# Patient Record
Sex: Female | Born: 1965 | Race: Asian | Hispanic: No | Marital: Married | Smoking: Never smoker
Health system: Southern US, Community
[De-identification: ages and names within clinical notes are randomized; demographics above are authoritative.]

## PROBLEM LIST (undated history)

## (undated) DIAGNOSIS — D259 Leiomyoma of uterus, unspecified: Secondary | ICD-10-CM

## (undated) DIAGNOSIS — R51 Headache: Secondary | ICD-10-CM

## (undated) HISTORY — PX: TUBAL LIGATION: SHX77

---

## 2002-02-03 HISTORY — PX: OTHER SURGICAL HISTORY: SHX169

## 2011-06-06 ENCOUNTER — Other Ambulatory Visit: Payer: Self-pay | Admitting: Family Medicine

## 2011-06-09 ENCOUNTER — Other Ambulatory Visit: Payer: Self-pay | Admitting: Family Medicine

## 2011-06-09 DIAGNOSIS — K115 Sialolithiasis: Secondary | ICD-10-CM

## 2011-06-11 ENCOUNTER — Ambulatory Visit
Admission: RE | Admit: 2011-06-11 | Discharge: 2011-06-11 | Disposition: A | Discharge status: Home / Self Care | Payer: Medicaid Other | Source: Ambulatory Visit | Attending: Family Medicine | Admitting: Family Medicine

## 2011-06-11 DIAGNOSIS — K115 Sialolithiasis: Secondary | ICD-10-CM

## 2011-06-12 ENCOUNTER — Other Ambulatory Visit: Payer: Self-pay

## 2011-06-12 ENCOUNTER — Other Ambulatory Visit: Payer: Self-pay | Admitting: Family Medicine

## 2011-06-12 DIAGNOSIS — K115 Sialolithiasis: Secondary | ICD-10-CM

## 2011-06-20 ENCOUNTER — Ambulatory Visit
Admission: RE | Admit: 2011-06-20 | Discharge: 2011-06-20 | Disposition: A | Discharge status: Home / Self Care | Payer: Medicaid Other | Source: Ambulatory Visit | Attending: Family Medicine | Admitting: Family Medicine

## 2011-06-20 DIAGNOSIS — K115 Sialolithiasis: Secondary | ICD-10-CM

## 2011-06-20 MED ORDER — IOHEXOL 300 MG/ML  SOLN
75.0000 mL | Freq: Once | INTRAMUSCULAR | Status: AC | PRN
  Administered 2011-06-20: 75 mL via INTRAVENOUS

## 2012-09-04 ENCOUNTER — Ambulatory Visit (INDEPENDENT_AMBULATORY_CARE_PROVIDER_SITE_OTHER): Payer: PRIVATE HEALTH INSURANCE | Admitting: Family Medicine

## 2012-09-04 VITALS — BP 110/78 | HR 71 | Temp 97.5°F | Resp 16 | Ht <= 58 in | Wt 127.0 lb

## 2012-09-04 DIAGNOSIS — R110 Nausea: Secondary | ICD-10-CM

## 2012-09-04 DIAGNOSIS — N939 Abnormal uterine and vaginal bleeding, unspecified: Secondary | ICD-10-CM

## 2012-09-04 DIAGNOSIS — N898 Other specified noninflammatory disorders of vagina: Secondary | ICD-10-CM

## 2012-09-04 DIAGNOSIS — R8290 Unspecified abnormal findings in urine: Secondary | ICD-10-CM

## 2012-09-04 DIAGNOSIS — R51 Headache: Secondary | ICD-10-CM

## 2012-09-04 LAB — POCT UA - MICROSCOPIC ONLY
Casts, Ur, LPF, POC: NEGATIVE
Crystals, Ur, HPF, POC: NEGATIVE
Mucus, UA: POSITIVE
Yeast, UA: NEGATIVE

## 2012-09-04 LAB — POCT URINALYSIS DIPSTICK
Bilirubin, UA: NEGATIVE
Glucose, UA: NEGATIVE
Ketones, UA: NEGATIVE
Nitrite, UA: NEGATIVE
Protein, UA: NEGATIVE
Spec Grav, UA: 1.02
Urobilinogen, UA: 0.2
pH, UA: 7

## 2012-09-04 LAB — POCT CBC
Granulocyte percent: 56.8 % (ref 37–80)
HCT, POC: 41.6 % (ref 37.7–47.9)
Hemoglobin: 13.4 g/dL (ref 12.2–16.2)
Lymph, poc: 2.6 (ref 0.6–3.4)
MCH, POC: 29.8 pg (ref 27–31.2)
MCHC: 32.2 g/dL (ref 31.8–35.4)
MCV: 92.4 fL (ref 80–97)
MID (cbc): 0.4 (ref 0–0.9)
MPV: 9.4 fL (ref 0–99.8)
POC Granulocyte: 4 (ref 2–6.9)
POC LYMPH PERCENT: 37.3 % (ref 10–50)
POC MID %: 5.9 % (ref 0–12)
Platelet Count, POC: 310 10*3/uL (ref 142–424)
RBC: 4.5 M/uL (ref 4.04–5.48)
RDW, POC: 13.7 %
WBC: 7.1 10*3/uL (ref 4.6–10.2)

## 2012-09-04 LAB — COMPREHENSIVE METABOLIC PANEL
Albumin: 4.2 g/dL (ref 3.5–5.2)
BUN: 10 mg/dL (ref 6–23)
CO2: 24 mEq/L (ref 19–32)
Calcium: 9.2 mg/dL (ref 8.4–10.5)
Chloride: 105 mEq/L (ref 96–112)
Creat: 0.52 mg/dL (ref 0.50–1.10)

## 2012-09-04 LAB — COMPREHENSIVE METABOLIC PANEL WITH GFR
ALT: 11 U/L (ref 0–35)
AST: 17 U/L (ref 0–37)
Alkaline Phosphatase: 49 U/L (ref 39–117)
Glucose, Bld: 86 mg/dL (ref 70–99)
Potassium: 4.3 meq/L (ref 3.5–5.3)
Sodium: 138 meq/L (ref 135–145)
Total Bilirubin: 0.3 mg/dL (ref 0.3–1.2)
Total Protein: 6.9 g/dL (ref 6.0–8.3)

## 2012-09-04 LAB — TSH: TSH: 3.774 u[IU]/mL (ref 0.350–4.500)

## 2012-09-04 NOTE — Progress Notes (Signed)
[facility]:  Office Visit  Chief Complaint:  Chief Complaint  Patient presents with  . establish care    HPI: Kimberly Schultz is a 47 y.o. female is  Here to establish care. She is a new patietn to our practice, she is originally from [place], has been here with her husband for the last 1.5 years.  Patient has been on her cycle for last 2 weeks, this is longer than normal for her, Gets cycle every month usually last 1 week.  Has not had any female exam/well woman check  When she came to the [place] her cycle changed, they became longer but this is the first one that has lasted 2 weeks First 4 days were heavy , after that she changes only 2 pads per day, more like spotting per pad She has  Never been pregnant, G0 She has been sexually active, no STDs No dizziness, no CP, + HA, + nausea only when she is in a moving vehicle. This has been a chronic problem since she came to the [place].  No pain with cycle I am unable to get information about ovarian, uterine or cervical cancer history or when her mother started having menopause There is a history of what sounds like ovarian cysts s/p single oophrectomy.   History reviewed. No pertinent past medical history. Past Surgical History  Procedure Laterality Date  . Oophrectomy  2004    in [place] for ovarian cysts/nodules   History   Social History  . Marital Status: Married    Spouse Name: N/A    Number of Children: N/A  . Years of Education: N/A   Social History Main Topics  . Smoking status: Never Smoker   . Smokeless tobacco: None  . Alcohol Use: No  . Drug Use: No  . Sexually Active: Yes   Other Topics Concern  . None   Social History Narrative  . None   History reviewed. No pertinent family history. Allergies  Allergen Reactions  . Fish Oil Swelling   Prior to Admission medications   Medication Sig Start Date End Date Taking? Authorizing Provider  Multiple Vitamin (MULTIVITAMIN) tablet Take 1 tablet by mouth  daily.   Yes Historical Provider, MD     ROS: The patient denies fevers, chills, night sweats, unintentional weight loss, chest pain, palpitations, wheezing, dyspnea on exertion,  vomiting, abdominal pain, dysuria, hematuria, melena, numbness, weakness, or tingling.   All other systems have been reviewed and were otherwise negative with the exception of those mentioned in the HPI and as above.    PHYSICAL EXAM: Filed Vitals:   09/04/12 1233  BP: 110/78  Pulse: 71  Temp: 97.5 F (36.4 C)  Resp: 16   Filed Vitals:   09/04/12 1233  Height: 4' 8.5" (1.435 m)  Weight: 127 lb (57.607 kg)   Body mass index is 27.98 kg/(m^2).  General: Alert, no acute distress HEENT:  Normocephalic, atraumatic, oropharynx patent. EOMI, PERRLA, fundoscopic exam is normal Cardiovascular:  Regular rate and rhythm, no rubs murmurs or gallops.  No Carotid bruits, radial pulse intact. No pedal edema.  Respiratory: Clear to auscultation bilaterally.  No wheezes, rales, or rhonchi.  No cyanosis, no use of accessory musculature GI: No organomegaly, abdomen is soft and non-tender, positive bowel sounds.  No masses. Skin: No rashes. Neurologic: Facial musculature symmetric. Psychiatric: Patient is appropriate throughout our interaction. Lymphatic: No cervical lymphadenopathy Musculoskeletal: Gait intact. Gu -looks normal, scant blood Adnexa is nontender. No rashes, no CMT,  no masses   LABS: Results for orders placed in visit on 09/04/12  POCT CBC      Result Value Range   WBC 7.1  4.6 - 10.2 K/uL   Lymph, poc 2.6  0.6 - 3.4   POC LYMPH PERCENT 37.3  10 - 50 %L   MID (cbc) 0.4  0 - 0.9   POC MID % 5.9  0 - 12 %M   POC Granulocyte 4.0  2 - 6.9   Granulocyte percent 56.8  37 - 80 %G   RBC 4.50  4.04 - 5.48 M/uL   Hemoglobin 13.4  12.2 - 16.2 g/dL   HCT, POC 16.1  09.6 - 47.9 %   MCV 92.4  80 - 97 fL   MCH, POC 29.8  27 - 31.2 pg   MCHC 32.2  31.8 - 35.4 g/dL   RDW, POC 04.5     Platelet Count, POC  310  142 - 424 K/uL   MPV 9.4  0 - 99.8 fL  POCT URINALYSIS DIPSTICK      Result Value Range   Color, UA yellow     Clarity, UA slightly cloudy     Glucose, UA neg     Bilirubin, UA neg     Ketones, UA neg     Spec Grav, UA 1.020     Blood, UA large     pH, UA 7.0     Protein, UA neg     Urobilinogen, UA 0.2     Nitrite, UA neg     Leukocytes, UA Trace    POCT UA - MICROSCOPIC ONLY      Result Value Range   WBC, Ur, HPF, POC 8-13     RBC, urine, microscopic 4-6     Bacteria, U Microscopic 2+     Mucus, UA pos     Epithelial cells, urine per micros 4-5     Crystals, Ur, HPF, POC neg     Casts, Ur, LPF, POC neg     Yeast, UA neg       EKG/XRAY:   Primary read interpreted by Dr. Conley Rolls at [facility].   ASSESSMENT/PLAN: Encounter Diagnoses  Name Primary?  . Abnormal vaginal bleeding Yes  . Headache(784.0)   . Nausea alone   . Abnormal urine finding    Language barrier, husbnad and friend are acting as translators Pleasant 38 y/o [place] woman who has been in the [place] for only 1.5 years,  who is here with her husband to establish care and also to get help for her 1x episode of mennorrhagia This has been onoing for 2 weeks On pelvic exam it looks like very scant bleeding, I will not do anything unless labs are abnormal.  Will not treat with abx until get  Urine cx results for possible contaminant in UA She gets nauseated from motion sickness of car, not used to it. Only happens in car. Advise to sit up front and focus on one object while car is moving.  Advise to return for annual exam with fasting labs and mammogram, cards for Drs Clelia Croft and Neva Seat given. If she continue to bleed consider pelvic US.  Will f/u with labs     Nashalie Sallis PHUONG, DO 09/04/2012 2:27 PM

## 2012-09-05 LAB — URINE CULTURE: Colony Count: 15000

## 2012-09-06 LAB — PAP IG, CT-NG, RFX HPV ASCU
Chlamydia Probe Amp: NEGATIVE
GC Probe Amp: NEGATIVE

## 2012-09-07 ENCOUNTER — Telehealth: Payer: Self-pay | Admitting: Family Medicine

## 2012-09-07 NOTE — Telephone Encounter (Signed)
LM that all labs are normal, Pap normal.

## 2012-09-11 ENCOUNTER — Ambulatory Visit (INDEPENDENT_AMBULATORY_CARE_PROVIDER_SITE_OTHER): Payer: PRIVATE HEALTH INSURANCE | Admitting: Family Medicine

## 2012-09-11 VITALS — BP 110/74 | HR 67 | Temp 97.9°F | Resp 12 | Ht <= 58 in | Wt 126.0 lb

## 2012-09-11 DIAGNOSIS — D259 Leiomyoma of uterus, unspecified: Secondary | ICD-10-CM

## 2012-09-11 DIAGNOSIS — Z0000 Encounter for general adult medical examination without abnormal findings: Secondary | ICD-10-CM

## 2012-09-11 LAB — POCT CBC
Granulocyte percent: 63.4 %G (ref 37–80)
HCT, POC: 44.2 % (ref 37.7–47.9)
Hemoglobin: 14.3 g/dL (ref 12.2–16.2)
Lymph, poc: 2.2 (ref 0.6–3.4)
MCH, POC: 30.1 pg (ref 27–31.2)
MCHC: 32.4 g/dL (ref 31.8–35.4)
MCV: 93.1 fL (ref 80–97)
MID (cbc): 0.5 (ref 0–0.9)
MPV: 10 fL (ref 0–99.8)
POC Granulocyte: 4.8 (ref 2–6.9)
POC LYMPH PERCENT: 29.9 %L (ref 10–50)
POC MID %: 6.7 %M (ref 0–12)
Platelet Count, POC: 315 10*3/uL (ref 142–424)
RBC: 4.75 M/uL (ref 4.04–5.48)
RDW, POC: 13.6 %
WBC: 7.5 10*3/uL (ref 4.6–10.2)

## 2012-09-11 LAB — POCT URINALYSIS DIPSTICK
Bilirubin, UA: NEGATIVE
Blood, UA: NEGATIVE
Glucose, UA: NEGATIVE
Ketones, UA: NEGATIVE
Leukocytes, UA: NEGATIVE
Nitrite, UA: NEGATIVE
Protein, UA: NEGATIVE
Spec Grav, UA: 1.025
Urobilinogen, UA: 0.2
pH, UA: 6

## 2012-09-11 LAB — LIPID PANEL
Cholesterol: 250 mg/dL — ABNORMAL HIGH (ref 0–200)
HDL: 51 mg/dL (ref >39)
LDL Cholesterol: 163 mg/dL — ABNORMAL HIGH (ref 0–99)
Total CHOL/HDL Ratio: 4.9 Ratio
Triglycerides: 178 mg/dL — ABNORMAL HIGH (ref <150)
VLDL: 36 mg/dL (ref 0–40)

## 2012-09-11 LAB — COMPREHENSIVE METABOLIC PANEL
ALT: 13 U/L (ref 0–35)
AST: 18 U/L (ref 0–37)
Albumin: 4.3 g/dL (ref 3.5–5.2)
Alkaline Phosphatase: 55 U/L (ref 39–117)
BUN: 7 mg/dL (ref 6–23)
CO2: 28 mEq/L (ref 19–32)
Calcium: 9.6 mg/dL (ref 8.4–10.5)
Chloride: 104 mEq/L (ref 96–112)
Creat: 0.59 mg/dL (ref 0.50–1.10)
Glucose, Bld: 86 mg/dL (ref 70–99)
Potassium: 4.7 mEq/L (ref 3.5–5.3)
Sodium: 141 mEq/L (ref 135–145)
Total Bilirubin: 0.6 mg/dL (ref 0.3–1.2)
Total Protein: 7.4 g/dL (ref 6.0–8.3)

## 2012-09-11 LAB — POCT URINE PREGNANCY: Preg Test, Ur: NEGATIVE

## 2012-09-11 LAB — TSH: TSH: 3.172 u[IU]/mL (ref 0.350–4.500)

## 2012-09-11 NOTE — Patient Instructions (Signed)
Your uterus is enlarged indicating a fibroid.  This needs further evaluation by a gynecologist.

## 2012-09-11 NOTE — Progress Notes (Signed)
Patient ID: Kimberly Schultz MRN: [id number], DOB: 26-Jan-1966, 47 y.o. Date of Encounter: 09/11/2012, 10:40 AM  Primary Physician: No PCP Per Patient  Chief Complaint: Physical (CPE)  HPI: 47 y.o. y/o female with history of noted below here for CPE.  Doing well. No issues/complaints.  LMP: last week Pap: Review of Systems: Consitutional: No fever, chills, fatigue, night sweats, lymphadenopathy, or weight changes. Eyes: No visual changes, eye redness, or discharge. ENT/Mouth: Ears: No otalgia, tinnitus, hearing loss, discharge. Nose: No congestion, rhinorrhea, sinus pain, or epistaxis. Throat: No sore throat, post nasal drip, or teeth pain. Cardiovascular: No CP, palpitations, diaphoresis, DOE, edema, orthopnea, PND. Respiratory: No cough, hemoptysis, SOB, or wheezing. Gastrointestinal: No anorexia, dysphagia, reflux, pain, nausea, vomiting, hematemesis, diarrhea, constipation, BRBPR, or melena. Breast: No discharge, pain, swelling, or mass. Genitourinary: No dysuria, frequency, urgency, hematuria, incontinence, nocturia, amenorrhea, vaginal discharge, pruritis, burning, abnormal bleeding, or pain. Musculoskeletal: No decreased ROM, myalgias, stiffness, joint swelling, or weakness. Skin: No rash, erythema, lesion changes, pain, warmth, jaundice, or pruritis. Neurological: No headache, dizziness, syncope, seizures, tremors, memory loss, coordination problems, or paresthesias. Psychological: No anxiety, depression, hallucinations, SI/HI. Endocrine: No fatigue, polydipsia, polyphagia, polyuria, or known diabetes. All other systems were reviewed and are otherwise negative.  No past medical history on file.   Past Surgical History  Procedure Laterality Date  . Oophrectomy  2004    in [place] for ovarian cysts/nodules  . Tubal ligation      Home Meds:  Prior to Admission medications   Medication Sig Start Date End Date Taking? Authorizing Provider  Multiple Vitamin (MULTIVITAMIN) tablet  Take 1 tablet by mouth daily.   Yes Historical Provider, MD    Allergies:  Allergies  Allergen Reactions  . Fish Oil Swelling    History   Social History  . Marital Status: Married    Spouse Name: N/A    Number of Children: N/A  . Years of Education: N/A   Occupational History  . Not on file.   Social History Main Topics  . Smoking status: Never Smoker   . Smokeless tobacco: Never Used  . Alcohol Use: No  . Drug Use: No  . Sexually Active: Yes   Other Topics Concern  . Not on file   Social History Narrative  . No narrative on file    No family history on file.  Physical Exam: Blood pressure 110/74, pulse 67, temperature 97.9 F (36.6 C), temperature source Oral, resp. rate 12, height 4' 8.5" (1.435 m), weight 126 lb (57.153 kg), last menstrual period 08/21/2012, SpO2 98.00%., Body mass index is 27.75 kg/(m^2). General: Well developed, well nourished, in no acute distress. HEENT: Normocephalic, atraumatic. Conjunctiva pink, sclera non-icteric. Pupils 2 mm constricting to 1 mm, round, regular, and equally reactive to light and accomodation. EOMI. Internal auditory canal clear. TMs with good cone of light and without pathology. Nasal mucosa pink. Nares are without discharge. No sinus tenderness. Oral mucosa pink. Dentition dental caries. Pharynx without exudate.   Neck: Supple. Trachea midline. No thyromegaly. Full ROM. No lymphadenopathy. Lungs: Clear to auscultation bilaterally without wheezes, rales, or rhonchi. Breathing is of normal effort and unlabored. Cardiovascular: RRR with S1 S2. No murmurs, rubs, or gallops appreciated. Distal pulses 2+ symmetrically. No carotid or abdominal bruits. Breast: Symmetrical. No masses. Nipples without discharge. Abdomen: Soft, non-tender, non-distended with normoactive bowel sounds. No hepatosplenomegaly or masses. No rebound/guarding. No CVA tenderness. Without hernias.  Genitourinary:  External genitalia without lesions. Vaginal  mucosa pink. Cervix pink and  without discharge. No cervical or adnexal tenderness. Uterus is at least 6 weeks size. Musculoskeletal: Full range of motion and 5/5 strength throughout. Without swelling, atrophy, tenderness, crepitus, or warmth. Extremities without clubbing, cyanosis, or edema. Calves supple. Skin: Warm and moist without erythema, ecchymosis, wounds, or rash. Neuro: A+Ox3. CN II-XII grossly intact. Moves all extremities spontaneously. Full sensation throughout. Normal gait. DTR 2+ throughout upper and lower extremities. Finger to nose intact. Psych:  Responds to questions appropriately with a normal affect.   Studies: CBC, CMET, Lipid, TSH all pending.   Assessment/Plan:  47 y.o. y/o female here for CPE.  Fibroid uterus with recent menorrhagia.  Will need gyn eval. - Results for orders placed in visit on 09/11/12  POCT URINE PREGNANCY      Result Value Range   Preg Test, Ur Negative    POCT URINALYSIS DIPSTICK      Result Value Range   Color, UA yellow\     Clarity, UA cloudy     Glucose, UA neg     Bilirubin, UA neg     Ketones, UA neg     Spec Grav, UA 1.025     Blood, UA neg     pH, UA 6.0     Protein, UA neg     Urobilinogen, UA 0.2     Nitrite, UA neg     Leukocytes, UA Negative    POCT CBC      Result Value Range   WBC 7.5  4.6 - 10.2 K/uL   Lymph, poc 2.2  0.6 - 3.4   POC LYMPH PERCENT 29.9  10 - 50 %L   MID (cbc) 0.5  0 - 0.9   POC MID % 6.7  0 - 12 %M   POC Granulocyte 4.8  2 - 6.9   Granulocyte percent 63.4  37 - 80 %G   RBC 4.75  4.04 - 5.48 M/uL   Hemoglobin 14.3  12.2 - 16.2 g/dL   HCT, POC 16.1  09.6 - 47.9 %   MCV 93.1  80 - 97 fL   MCH, POC 30.1  27 - 31.2 pg   MCHC 32.4  31.8 - 35.4 g/dL   RDW, POC 04.5     Platelet Count, POC 315  142 - 424 K/uL   MPV 10.0  0 - 99.8 fL   Annual physical exam - Plan: POCT urine pregnancy, POCT urinalysis dipstick, POCT CBC, Lipid panel, Comprehensive metabolic panel, TSH, Ambulatory referral to  Gynecology  Uterine fibroid - Plan: Ambulatory referral to Gynecology    Signed, Elvina Sidle, MD 09/11/2012 10:40 AM

## 2012-10-11 ENCOUNTER — Other Ambulatory Visit (HOSPITAL_COMMUNITY): Payer: Self-pay | Admitting: Obstetrics & Gynecology

## 2012-10-11 DIAGNOSIS — IMO0002 Reserved for concepts with insufficient information to code with codable children: Secondary | ICD-10-CM

## 2012-10-18 ENCOUNTER — Ambulatory Visit (HOSPITAL_COMMUNITY)
Admission: RE | Admit: 2012-10-18 | Discharge: 2012-10-18 | Disposition: A | Discharge status: Home / Self Care | Payer: 59 | Source: Ambulatory Visit | Attending: Obstetrics & Gynecology | Admitting: Obstetrics & Gynecology

## 2012-10-18 DIAGNOSIS — N949 Unspecified condition associated with female genital organs and menstrual cycle: Secondary | ICD-10-CM | POA: Insufficient documentation

## 2012-10-18 DIAGNOSIS — N7013 Chronic salpingitis and oophoritis: Secondary | ICD-10-CM | POA: Insufficient documentation

## 2012-10-18 DIAGNOSIS — N971 Female infertility of tubal origin: Secondary | ICD-10-CM | POA: Insufficient documentation

## 2012-10-18 DIAGNOSIS — IMO0002 Reserved for concepts with insufficient information to code with codable children: Secondary | ICD-10-CM

## 2012-10-18 MED ORDER — IOHEXOL 300 MG/ML  SOLN
20.0000 mL | Freq: Once | INTRAMUSCULAR | Status: AC | PRN
  Administered 2012-10-18: 20 mL via INTRAVENOUS

## 2012-12-13 ENCOUNTER — Other Ambulatory Visit: Payer: Self-pay | Admitting: Obstetrics & Gynecology

## 2012-12-21 ENCOUNTER — Other Ambulatory Visit: Payer: Self-pay | Admitting: Obstetrics & Gynecology

## 2012-12-21 NOTE — Patient Instructions (Addendum)
   Your procedure is scheduled on: Friday, Nov 21  Enter through the [employer] [facility] at: 945 AM Pick up the phone at the desk and dial [phone] and inform us of your arrival.  Please call this number if you have any problems the morning of surgery: [phone]  Remember: Do not eat or drink after midnight: Thursday Take these medicines the morning of surgery with a SIP OF WATER:  NONE  Do not wear jewelry, make-up, or FINGER nail polish No metal in your hair or on your body. Do not wear lotions, powders, perfumes. You may wear deodorant.  Please use your CHG wash as directed prior to surgery.  Do not shave anywhere for at least 12 hours prior to first CHG shower.  Do not bring valuables to the hospital. Contacts, dentures or bridgework may not be worn into surgery.  Leave suitcase in the car. After Surgery it may be brought to your room. For patients being admitted to the hospital, checkout time is 11:00am the day of discharge.  Patients discharged on the day of surgery will not be allowed to drive home.  HOME WITH HUSBAND PIANG  LIANSUM

## 2012-12-22 ENCOUNTER — Encounter (HOSPITAL_COMMUNITY)
Admission: RE | Admit: 2012-12-22 | Discharge: 2012-12-22 | Disposition: A | Discharge status: Home / Self Care | Payer: 59 | Source: Ambulatory Visit | Attending: Obstetrics & Gynecology | Admitting: Obstetrics & Gynecology

## 2012-12-22 ENCOUNTER — Encounter (HOSPITAL_COMMUNITY): Payer: Self-pay

## 2012-12-22 HISTORY — DX: Headache: R51

## 2012-12-22 LAB — CBC
HCT: 38.3 % (ref 36.0–46.0)
Hemoglobin: 13.1 g/dL (ref 12.0–15.0)
MCH: 29.5 pg (ref 26.0–34.0)
RBC: 4.44 MIL/uL (ref 3.87–5.11)
RDW: 12.8 % (ref 11.5–15.5)
WBC: 8 10*3/uL (ref 4.0–10.5)

## 2012-12-22 LAB — BASIC METABOLIC PANEL
Chloride: 102 mEq/L (ref 96–112)
Creatinine, Ser: 0.62 mg/dL (ref 0.50–1.10)
GFR calc Af Amer: >90 mL/min (ref >90)
Potassium: 4 mEq/L (ref 3.5–5.1)
Sodium: 136 mEq/L (ref 135–145)

## 2012-12-22 NOTE — Pre-Procedure Instructions (Signed)
Burmese Nurse, learning disability did not show up for PAT appt. today.  Husband Piang Liansum was with patient (he spoke/read [place]) at PAT appt. and translated for [place].  [place]ese interpreter was requested with [place].

## 2012-12-23 NOTE — H&P (Signed)
Kimberly Schultz is an 47 y.o. female  G0 with fibroid uterus and menorrhagia. She has several fibroids including a submucosal myoma, declined hysterectomy and desired limited surgery. Hence we are proceeding with hysteroscopic myomectomy. Patient has past h/o endometriosis, laparotomy and resection of endometriosis, noted to have left pelvic adhesions with tube, colon and ovary. She has longstanding infertility and understands IVF with donor eggs as option and has declined.  Nl recent Pap, but no prior records since she recently arrived from [place].   No LMP recorded.    Past Medical History  Diagnosis Date  . Headache(784.0)     otc med prn   Past Surgical History  Procedure Laterality Date  . Oophrectomy  2004    in [place] for ovarian cysts/nodules  . Tubal ligation     No family history on file.  Social History:  reports that she has never smoked. She has never used smokeless tobacco. She reports that she does not drink alcohol or use illicit drugs.  Allergies:  Allergies  Allergen Reactions  . Fish Oil Swelling    No prescriptions prior to admission   Review of Systems  Constitutional: Negative for fever.  Eyes: Negative for blurred vision.  Respiratory: Negative for cough and shortness of breath.   Neurological: Negative for dizziness and headaches.    There were no vitals taken for this visit. Physical Exam A&O x 3, no acute distress. Pleasant HEENT neg, no thyromegaly Lungs CTA bilat CV RRR, S1S2 normal Abdo soft, non tender, non acute Extr no edema/ tenderness Pelvic enlarged uterus with nodularity.    Assessment/Plan: 47 yo female with fibroid uterus including a submucosal myoma and menorrhagia. Patient has h/o endometriosis and infertility but deferring laparoscopy due to lack of pain or dyspareunia and considering she is 47, does not want to pursue infertility surgery due to cost. She is here for hysteroscopy myomectomy with resectoscope. Risks/complications of  surgery reviewed, esp. Incomplete resection if too large or bloody and risk of perforation reviewed.  Risks/complications of surgery reviewed incl infection, bleeding, damage to internal organs including bladder, bowels, ureters, blood vessels, other risks from anesthesia, VTE and delayed complications of any surgery, complications in future surgery reviewed.   Cory Kitt R 12/23/2012, 10:35 PM

## 2012-12-24 ENCOUNTER — Encounter (HOSPITAL_COMMUNITY): Payer: Self-pay | Admitting: Obstetrics & Gynecology

## 2012-12-24 ENCOUNTER — Encounter (HOSPITAL_COMMUNITY)
Admission: RE | Disposition: A | Discharge status: Home / Self Care | Payer: Self-pay | Source: Ambulatory Visit | Attending: Obstetrics & Gynecology

## 2012-12-24 ENCOUNTER — Ambulatory Visit (HOSPITAL_COMMUNITY)
Admission: RE | Admit: 2012-12-24 | Discharge: 2012-12-24 | Disposition: A | Discharge status: Home / Self Care | Payer: 59 | Source: Ambulatory Visit | Attending: Obstetrics & Gynecology | Admitting: Obstetrics & Gynecology

## 2012-12-24 ENCOUNTER — Encounter (HOSPITAL_COMMUNITY): Payer: Self-pay | Admitting: Pharmacy Technician

## 2012-12-24 ENCOUNTER — Encounter (HOSPITAL_COMMUNITY): Payer: 59 | Admitting: Anesthesiology

## 2012-12-24 ENCOUNTER — Ambulatory Visit (HOSPITAL_COMMUNITY): Payer: 59 | Admitting: Anesthesiology

## 2012-12-24 DIAGNOSIS — D259 Leiomyoma of uterus, unspecified: Secondary | ICD-10-CM

## 2012-12-24 DIAGNOSIS — N920 Excessive and frequent menstruation with regular cycle: Secondary | ICD-10-CM | POA: Insufficient documentation

## 2012-12-24 DIAGNOSIS — N979 Female infertility, unspecified: Secondary | ICD-10-CM | POA: Insufficient documentation

## 2012-12-24 DIAGNOSIS — N840 Polyp of corpus uteri: Secondary | ICD-10-CM | POA: Insufficient documentation

## 2012-12-24 DIAGNOSIS — D250 Submucous leiomyoma of uterus: Secondary | ICD-10-CM | POA: Insufficient documentation

## 2012-12-24 HISTORY — DX: Leiomyoma of uterus, unspecified: D25.9

## 2012-12-24 HISTORY — PX: DILITATION & CURRETTAGE/HYSTROSCOPY WITH VERSAPOINT RESECTION: SHX5571

## 2012-12-24 SURGERY — DILATATION & CURETTAGE/HYSTEROSCOPY WITH VERSAPOINT RESECTION
Anesthesia: General | Site: Cervix | Wound class: Clean Contaminated

## 2012-12-24 MED ORDER — LIDOCAINE-EPINEPHRINE 1 %-1:100000 IJ SOLN
INTRAMUSCULAR | Status: AC
  Filled 2012-12-24: qty 1

## 2012-12-24 MED ORDER — PROPOFOL 10 MG/ML IV BOLUS
INTRAVENOUS | Status: DC | PRN
  Administered 2012-12-24: 150 mg via INTRAVENOUS

## 2012-12-24 MED ORDER — LIDOCAINE HCL (CARDIAC) 20 MG/ML IV SOLN
INTRAVENOUS | Status: DC | PRN
  Administered 2012-12-24: 40 mg via INTRAVENOUS
  Administered 2012-12-24: 20 mg via INTRAVENOUS

## 2012-12-24 MED ORDER — KETOROLAC TROMETHAMINE 30 MG/ML IJ SOLN
INTRAMUSCULAR | Status: AC
  Filled 2012-12-24: qty 1

## 2012-12-24 MED ORDER — LIDOCAINE HCL 1 % IJ SOLN
INTRAMUSCULAR | Status: AC
  Filled 2012-12-24: qty 20

## 2012-12-24 MED ORDER — LIDOCAINE HCL (CARDIAC) 20 MG/ML IV SOLN
INTRAVENOUS | Status: AC
  Filled 2012-12-24: qty 5

## 2012-12-24 MED ORDER — ONDANSETRON HCL 4 MG/2ML IJ SOLN
INTRAMUSCULAR | Status: DC | PRN
  Administered 2012-12-24: 4 mg via INTRAVENOUS

## 2012-12-24 MED ORDER — FENTANYL CITRATE 0.05 MG/ML IJ SOLN
INTRAMUSCULAR | Status: DC | PRN
  Administered 2012-12-24 (×3): 50 ug via INTRAVENOUS

## 2012-12-24 MED ORDER — MIDAZOLAM HCL 2 MG/2ML IJ SOLN
INTRAMUSCULAR | Status: DC | PRN
  Administered 2012-12-24: 1 mg via INTRAVENOUS

## 2012-12-24 MED ORDER — MEPERIDINE HCL 25 MG/ML IJ SOLN: 6.2500 mg | INTRAMUSCULAR | Status: DC | PRN

## 2012-12-24 MED ORDER — MIDAZOLAM HCL 2 MG/2ML IJ SOLN
INTRAMUSCULAR | Status: AC
  Filled 2012-12-24: qty 2

## 2012-12-24 MED ORDER — CEFAZOLIN SODIUM-DEXTROSE 2-3 GM-% IV SOLR
INTRAVENOUS | Status: AC
  Filled 2012-12-24: qty 50

## 2012-12-24 MED ORDER — KETOROLAC TROMETHAMINE 30 MG/ML IJ SOLN
INTRAMUSCULAR | Status: DC | PRN
  Administered 2012-12-24: 30 mg via INTRAVENOUS

## 2012-12-24 MED ORDER — FENTANYL CITRATE 0.05 MG/ML IJ SOLN
INTRAMUSCULAR | Status: AC
  Filled 2012-12-24: qty 2

## 2012-12-24 MED ORDER — IBUPROFEN 200 MG PO TABS: 400.0000 mg | ORAL_TABLET | Freq: Four times a day (QID) | ORAL | Status: AC | PRN

## 2012-12-24 MED ORDER — KETOROLAC TROMETHAMINE 30 MG/ML IJ SOLN: 15.0000 mg | Freq: Once | INTRAMUSCULAR | Status: DC | PRN

## 2012-12-24 MED ORDER — PROPOFOL 10 MG/ML IV EMUL
INTRAVENOUS | Status: AC
  Filled 2012-12-24: qty 20

## 2012-12-24 MED ORDER — CEFAZOLIN SODIUM-DEXTROSE 2-3 GM-% IV SOLR
2.0000 g | INTRAVENOUS | Status: AC
  Administered 2012-12-24: 2 g via INTRAVENOUS

## 2012-12-24 MED ORDER — LIDOCAINE-EPINEPHRINE 1 %-1:100000 IJ SOLN
INTRAMUSCULAR | Status: DC | PRN
  Administered 2012-12-24: 10 mL

## 2012-12-24 MED ORDER — LACTATED RINGERS IV SOLN
INTRAVENOUS | Status: DC
  Administered 2012-12-24 (×2): via INTRAVENOUS

## 2012-12-24 MED ORDER — FENTANYL CITRATE 0.05 MG/ML IJ SOLN: 25.0000 ug | INTRAMUSCULAR | Status: DC | PRN

## 2012-12-24 MED ORDER — ONDANSETRON HCL 4 MG/2ML IJ SOLN
INTRAMUSCULAR | Status: AC
  Filled 2012-12-24: qty 2

## 2012-12-24 MED ORDER — METOCLOPRAMIDE HCL 5 MG/ML IJ SOLN: 10.0000 mg | Freq: Once | INTRAMUSCULAR | Status: DC | PRN

## 2012-12-24 SURGICAL SUPPLY — 19 items
CANISTER SUCT 3000ML (MISCELLANEOUS) ×2 IMPLANT
CATH ROBINSON RED A/P 16FR (CATHETERS) ×2 IMPLANT
CLOTH BEACON ORANGE TIMEOUT ST (SAFETY) ×2 IMPLANT
CONTAINER PREFILL 10% NBF 60ML (FORM) ×4 IMPLANT
DRESSING TELFA 8X3 (GAUZE/BANDAGES/DRESSINGS) ×2 IMPLANT
ELECT REM PT RETURN 9FT ADLT (ELECTROSURGICAL) ×2
ELECTRODE REM PT RTRN 9FT ADLT (ELECTROSURGICAL) ×1 IMPLANT
ELECTRODE ROLLER VERSAPOINT (ELECTRODE) ×2 IMPLANT
ELECTRODE RT ANGLE VERSAPOINT (CUTTING LOOP) IMPLANT
GLOVE BIO SURGEON STRL SZ7 (GLOVE) ×2 IMPLANT
GLOVE BIOGEL PI IND STRL 7.0 (GLOVE) ×1 IMPLANT
GLOVE BIOGEL PI INDICATOR 7.0 (GLOVE) ×1
GLOVE SURG SS PI 7.0 STRL IVOR (GLOVE) ×4 IMPLANT
GOWN STRL REIN XL XLG (GOWN DISPOSABLE) ×6 IMPLANT
LOOP ANGLED CUTTING 22FR (CUTTING LOOP) IMPLANT
PACK HYSTEROSCOPY LF (CUSTOM PROCEDURE TRAY) ×2 IMPLANT
PAD OB MATERNITY 4.3X12.25 (PERSONAL CARE ITEMS) ×2 IMPLANT
TOWEL OR 17X24 6PK STRL BLUE (TOWEL DISPOSABLE) ×4 IMPLANT
WATER STERILE IRR 1000ML POUR (IV SOLUTION) ×2 IMPLANT

## 2012-12-24 NOTE — Anesthesia Preprocedure Evaluation (Addendum)
Anesthesia Evaluation  Patient identified by MRN, date of birth, ID band Patient awake    Reviewed: Allergy & Precautions, H&P , NPO status , Patient's Chart, lab work & pertinent test results, reviewed documented beta blocker date and time   History of Anesthesia Complications Negative for: history of anesthetic complications  Airway Mallampati: III TM Distance: >3 FB Neck ROM: full    Dental  (+) Teeth Intact   Pulmonary neg pulmonary ROS,  breath sounds clear to auscultation  Pulmonary exam normal       Cardiovascular Exercise Tolerance: Good negative cardio ROS  Rhythm:regular Rate:Normal     Neuro/Psych negative neurological ROS  negative psych ROS   GI/Hepatic negative GI ROS, Neg liver ROS,   Endo/Other  negative endocrine ROS  Renal/GU negative Renal ROS  Female GU complaint     Musculoskeletal   Abdominal   Peds  Hematology negative hematology ROS (+)   Anesthesia Other Findings   Reproductive/Obstetrics negative OB ROS                           Anesthesia Physical Anesthesia Plan  ASA: I  Anesthesia Plan: General LMA   Post-op Pain Management:    Induction:   Airway Management Planned:   Additional Equipment:   Intra-op Plan:   Post-operative Plan:   Informed Consent: I have reviewed the patients History and Physical, chart, labs and discussed the procedure including the risks, benefits and alternatives for the proposed anesthesia with the patient or authorized representative who has indicated his/her understanding and acceptance.   Dental Advisory Given  Plan Discussed with: CRNA and Surgeon  Anesthesia Plan Comments:         Anesthesia Quick Evaluation  

## 2012-12-24 NOTE — Anesthesia Postprocedure Evaluation (Signed)
  Anesthesia Post-op Note  Anesthesia Post Note  Patient: Kimberly Schultz  Procedure(s) Performed: Procedure(s) (LRB): DILATATION & CURETTAGE/HYSTEROSCOPY WITH VERSAPOINT RESECTION of fibroid (N/A)  Anesthesia type: General  Patient location: PACU  Post pain: Pain level controlled  Post assessment: Post-op Vital signs reviewed  Last Vitals:  Filed Vitals:   12/24/12 1400  BP: 131/63  Pulse: 79  Temp: 36.9 C  Resp: 20    Post vital signs: Reviewed  Level of consciousness: sedated  Complications: No apparent anesthesia complications

## 2012-12-24 NOTE — Transfer of Care (Signed)
Immediate Anesthesia Transfer of Care Note  Patient: Kimberly Schultz  Procedure(s) Performed: Procedure(s): DILATATION & CURETTAGE/HYSTEROSCOPY WITH VERSAPOINT RESECTION of fibroid (N/A)  Patient Location: PACU  Anesthesia Type:General  Level of Consciousness: awake, sedated and patient cooperative  Airway & Oxygen Therapy: Patient Spontanous Breathing and Patient connected to nasal cannula oxygen  Post-op Assessment: Report given to PACU RN and Post -op Vital signs reviewed and stable  Post vital signs: Reviewed and stable  Complications: No apparent anesthesia complications

## 2012-12-24 NOTE — Op Note (Signed)
Preoperative diagnosis: Menometrorrhagia, Multifibroid uterus with a lower segment submucosal myoma.   Postop diagnosis: as above.  Procedure: Hysteroscopic resection of submucosal myoma using Versapoint roller ball and D&C.  Anesthesia General   Surgeon: Shea Evans, MD  Assistant: None IV fluids: 1500 cc Estimated blood loss: 50 cc Urine output: straight catheter preop   Complications none  Condition stable  Disposition PACU  Specimen: Fibroid chips and endometrial polypoidal tissue   Procedure  Indication: 47 yo, G0, multifibroid uterus with a submucosal myoma and menometrorrhagia. Patient desired minimal surgery for menorrhagia, declined laparoscopic or open myomectomy and declined hysterectomy. After risks/benefit counseling she chose to proceed with hysteroscopic myomectomy. Patient was counseled on risks/ complications including infection, bleeding, damage to internal organs, she understood and agrees, gave informed written consent with Pharmacist, community..  Patient was brought to the operating room with IV running. Time out was carried out. She received preop 1 gm Ancef. She underwent general anesthesia  without complications. She was given dorsolithotomy position. Parts were prepped and draped in standard fashion. Bladder was catheterized once. Bimanual exam revealed uterus to be anteverted and 10 wk size with fibroids. Speculum was placed and cervix was grasped with single-tooth tenaculum. Cervical block with 10 cc 1% Xylocaine with epinephrine. The uterus was sounded to 10 cm. Cervical os was dilated to 25 [place] dilator. Versapoint Hysteroscope was introduced in the uterine cavity under vision, using Saline. Findings: Fundus appeared normal with normal tubal ostii. Polypoidal endometrium noted. A right lower segment submucosal myoma noted filling the lower segment.  Hysteroscopic resection/ vaporization of myoma done in stepwise fashion using Versapoint Rollerball. Bleeding secured  with cautery. Gentle D&C performed. Fibroid segments and polypoidal tissue sent to pathology.    Saline fluid deficit: 175 cc.   All counts are correct x2. No complications. Patient brought to the recovery room in stable condition.  Patient will be discharged home today. Follow up in 2 weeks in office. Warning signs of infection and excessive bleeding reviewed.   V.Ebonique Hallstrom, MD.

## 2012-12-28 ENCOUNTER — Encounter (HOSPITAL_COMMUNITY): Payer: Self-pay | Admitting: Obstetrics & Gynecology

## 2013-09-17 ENCOUNTER — Ambulatory Visit (INDEPENDENT_AMBULATORY_CARE_PROVIDER_SITE_OTHER): Payer: PRIVATE HEALTH INSURANCE | Admitting: Emergency Medicine

## 2013-09-17 VITALS — BP 104/78 | HR 78 | Temp 98.8°F | Resp 16 | Ht <= 58 in | Wt 132.0 lb

## 2013-09-17 DIAGNOSIS — R1013 Epigastric pain: Secondary | ICD-10-CM

## 2013-09-17 DIAGNOSIS — G8929 Other chronic pain: Secondary | ICD-10-CM

## 2013-09-17 DIAGNOSIS — E0590 Thyrotoxicosis, unspecified without thyrotoxic crisis or storm: Secondary | ICD-10-CM

## 2013-09-17 LAB — COMPREHENSIVE METABOLIC PANEL
ALBUMIN: 4.3 g/dL (ref 3.5–5.2)
ALT: 13 U/L (ref 0–35)
AST: 17 U/L (ref 0–37)
Alkaline Phosphatase: 42 U/L (ref 39–117)
BILIRUBIN TOTAL: 0.4 mg/dL (ref 0.2–1.2)
BUN: 5 mg/dL — ABNORMAL LOW (ref 6–23)
CO2: 25 mEq/L (ref 19–32)
Calcium: 9.1 mg/dL (ref 8.4–10.5)
Chloride: 106 mEq/L (ref 96–112)
Creat: 0.6 mg/dL (ref 0.50–1.10)
GLUCOSE: 94 mg/dL (ref 70–99)
POTASSIUM: 4.8 meq/L (ref 3.5–5.3)
SODIUM: 139 meq/L (ref 135–145)
TOTAL PROTEIN: 7.3 g/dL (ref 6.0–8.3)

## 2013-09-17 LAB — POCT URINALYSIS DIPSTICK
Bilirubin, UA: NEGATIVE
Glucose, UA: NEGATIVE
Ketones, UA: NEGATIVE
Leukocytes, UA: NEGATIVE
NITRITE UA: NEGATIVE
PH UA: 7
RBC UA: NEGATIVE
Spec Grav, UA: 1.02
Urobilinogen, UA: 0.2

## 2013-09-17 LAB — POCT CBC
GRANULOCYTE PERCENT: 60.6 % (ref 37–80)
HCT, POC: 42.8 % (ref 37.7–47.9)
Hemoglobin: 13.9 g/dL (ref 12.2–16.2)
LYMPH, POC: 2.2 (ref 0.6–3.4)
MCH, POC: 29.1 pg (ref 27–31.2)
MCHC: 32.5 g/dL (ref 31.8–35.4)
MCV: 89.7 fL (ref 80–97)
MID (CBC): 0.7 (ref 0–0.9)
MPV: 7.7 fL (ref 0–99.8)
PLATELET COUNT, POC: 267 10*3/uL (ref 142–424)
POC GRANULOCYTE: 4.4 (ref 2–6.9)
POC LYMPH PERCENT: 30.3 %L (ref 10–50)
POC MID %: 9.1 % (ref 0–12)
RBC: 4.77 M/uL (ref 4.04–5.48)
RDW, POC: 12.7 %
WBC: 7.2 10*3/uL (ref 4.6–10.2)

## 2013-09-17 LAB — POCT URINE PREGNANCY: Preg Test, Ur: NEGATIVE

## 2013-09-17 LAB — GLUCOSE, POCT (MANUAL RESULT ENTRY): POC Glucose: 85 mg/dl (ref 70–99)

## 2013-09-17 LAB — TSH: TSH: 2.177 u[IU]/mL (ref 0.350–4.500)

## 2013-09-17 MED ORDER — MECLIZINE HCL 12.5 MG PO TABS: ORAL_TABLET | ORAL | Status: AC

## 2013-09-17 MED ORDER — OMEPRAZOLE 40 MG PO CPDR: 40.0000 mg | DELAYED_RELEASE_CAPSULE | Freq: Every day | ORAL | Status: AC

## 2013-09-17 NOTE — Patient Instructions (Signed)
Gastritis, Adult Gastritis is soreness and swelling (inflammation) of the lining of the stomach. Gastritis can develop as a sudden onset (acute) or long-term (chronic) condition. If gastritis is not treated, it can lead to stomach bleeding and ulcers. CAUSES  Gastritis occurs when the stomach lining is weak or damaged. Digestive juices from the stomach then inflame the weakened stomach lining. The stomach lining may be weak or damaged due to viral or bacterial infections. One common bacterial infection is the Helicobacter pylori infection. Gastritis can also result from excessive alcohol consumption, taking certain medicines, or having too much acid in the stomach.  SYMPTOMS  In some cases, there are no symptoms. When symptoms are present, they may include:  Pain or a burning sensation in the upper abdomen.  Nausea.  Vomiting.  An uncomfortable feeling of fullness after eating. DIAGNOSIS  Your caregiver may suspect you have gastritis based on your symptoms and a physical exam. To determine the cause of your gastritis, your caregiver may perform the following:  Blood or stool tests to check for the H pylori bacterium.  Gastroscopy. A thin, flexible tube (endoscope) is passed down the esophagus and into the stomach. The endoscope has a light and camera on the end. Your caregiver uses the endoscope to view the inside of the stomach.  Taking a tissue sample (biopsy) from the stomach to examine under a microscope. TREATMENT  Depending on the cause of your gastritis, medicines may be prescribed. If you have a bacterial infection, such as an H pylori infection, antibiotics may be given. If your gastritis is caused by too much acid in the stomach, H2 blockers or antacids may be given. Your caregiver may recommend that you stop taking aspirin, ibuprofen, or other nonsteroidal anti-inflammatory drugs (NSAIDs). HOME CARE INSTRUCTIONS  Only take over-the-counter or prescription medicines as directed by  your caregiver.  If you were given antibiotic medicines, take them as directed. Finish them even if you start to feel better.  Drink enough fluids to keep your urine clear or pale yellow.  Avoid foods and drinks that make your symptoms worse, such as:  Caffeine or alcoholic drinks.  Chocolate.  Peppermint or mint flavorings.  Garlic and onions.  Spicy foods.  Citrus fruits, such as oranges, lemons, or limes.  Tomato-based foods such as sauce, chili, salsa, and pizza.  Fried and fatty foods.  Eat small, frequent meals instead of large meals. SEEK IMMEDIATE MEDICAL CARE IF:   You have black or dark red stools.  You vomit blood or material that looks like coffee grounds.  You are unable to keep fluids down.  Your abdominal pain gets worse.  You have a fever.  You do not feel better after 1 week.  You have any other questions or concerns. MAKE SURE YOU:  Understand these instructions.  Will watch your condition.  Will get help right away if you are not doing well or get worse. Document Released: 01/14/2001 Document Revised: 07/22/2011 Document Reviewed: 03/05/2011 [facility] Patient Information 2015 [place], [place]. This information is not intended to replace advice given to you by your health care provider. Make sure you discuss any questions you have with your health care provider. Dizziness Dizziness is a common problem. It is a feeling of unsteadiness or light-headedness. You may feel like you are about to faint. Dizziness can lead to injury if you stumble or fall. A person of any age group can suffer from dizziness, but dizziness is more common in older adults. CAUSES  Dizziness can  be caused by many different things, including:  Middle ear problems.  Standing for too long.  Infections.  An allergic reaction.  Aging.  An emotional response to something, such as the sight of blood.  Side effects of medicines.  Tiredness.  Problems with circulation  or blood pressure.  Excessive use of alcohol or medicines, or illegal drug use.  Breathing too fast (hyperventilation).  An irregular heart rhythm (arrhythmia).  A low red blood cell count (anemia).  Pregnancy.  Vomiting, diarrhea, fever, or other illnesses that cause body fluid loss (dehydration).  Diseases or conditions such as Parkinson's disease, high blood pressure (hypertension), diabetes, and thyroid problems.  Exposure to extreme heat. DIAGNOSIS  Your health care provider will ask about your symptoms, perform a physical exam, and perform an electrocardiogram (ECG) to record the electrical activity of your heart. Your health care provider may also perform other heart or blood tests to determine the cause of your dizziness. These may include:  Transthoracic echocardiogram (TTE). During echocardiography, sound waves are used to evaluate how blood flows through your heart.  Transesophageal echocardiogram (TEE).  Cardiac monitoring. This allows your health care provider to monitor your heart rate and rhythm in real time.  Holter monitor. This is a portable device that records your heartbeat and can help diagnose heart arrhythmias. It allows your health care provider to track your heart activity for several days if needed.  Stress tests by exercise or by giving medicine that makes the heart beat faster. TREATMENT  Treatment of dizziness depends on the cause of your symptoms and can vary greatly. HOME CARE INSTRUCTIONS   Drink enough fluids to keep your urine clear or pale yellow. This is especially important in very hot weather. In older adults, it is also important in cold weather.  Take your medicine exactly as directed if your dizziness is caused by medicines. When taking blood pressure medicines, it is especially important to get up slowly.  Rise slowly from chairs and steady yourself until you feel okay.  In the morning, first sit up on the side of the bed. When you feel  okay, stand slowly while holding onto something until you know your balance is fine.  Move your legs often if you need to stand in one place for a long time. Tighten and relax your muscles in your legs while standing.  Have someone stay with you for 1-2 days if dizziness continues to be a problem. Do this until you feel you are well enough to stay alone. Have the person call your health care provider if he or she notices changes in you that are concerning.  Do not drive or use heavy machinery if you feel dizzy.  Do not drink alcohol. SEEK IMMEDIATE MEDICAL CARE IF:   Your dizziness or light-headedness gets worse.  You feel nauseous or vomit.  You have problems talking, walking, or using your arms, hands, or legs.  You feel weak.  You are not thinking clearly or you have trouble forming sentences. It may take a friend or family member to notice this.  You have chest pain, abdominal pain, shortness of breath, or sweating.  Your vision changes.  You notice any bleeding.  You have side effects from medicine that seems to be getting worse rather than better. MAKE SURE YOU:   Understand these instructions.  Will watch your condition.  Will get help right away if you are not doing well or get worse. Document Released: 07/16/2000 Document Revised: 01/25/2013 Document Reviewed:  08/09/2010 [employer] Patient Information 2015 [place], [place]. This information is not intended to replace advice given to you by your health care provider. Make sure you discuss any questions you have with your health care provider.

## 2013-09-17 NOTE — Progress Notes (Signed)
Subjective:   This chart was scribed for Kimberly Schultz A. Kimberly Farrier, MD by Forrestine Him, [facility] Scribe. This patient was seen in room 13 and the patient's care was started 9:16 AM.    Patient ID: Kimberly Schultz, female    DOB: 06-29-1965, 48 y.o.   MRN: [id number]  Chief Complaint  Patient presents with  . Abdominal Pain    x 2 months  . Dizziness    HPI  A TRANSLATOR WAS USED FOR THIS ENCOUNTER  HPI Comments: Kimberly Schultz is a 48 y.o. female without any medical problems who presents to [facility] complaining of abdominal pain x 2 months. Pt also reports dizziness, acid reflux, and heart burn onset 2 months. Pt states dizziness is worsened at night time. She has tried OTC Pepto Bismol without any improvement for symptoms. At this time she denies any fever or chills. Pt reports light periods lasting 1 day at times when menstrual cycle comes. Pt with known allergy to fish oil. No other concerns this visit.   Patient Active Problem List   Diagnosis Date Noted  . Uterine fibroid 12/24/2012   Past Medical History  Diagnosis Date  . Headache(784.0)     otc med prn  . Uterine fibroid 12/24/2012   Past Surgical History  Procedure Laterality Date  . Oophrectomy  2004    in [place] for ovarian cysts/nodules  . Tubal ligation    . Dilitation & currettage/hystroscopy with versapoint resection N/A 12/24/2012    Procedure: DILATATION & CURETTAGE/HYSTEROSCOPY WITH VERSAPOINT RESECTION of fibroid;  Surgeon: Elveria Royals, MD;  Location: [facility];  Service: Gynecology;  Laterality: N/A;   Allergies  Allergen Reactions  . Fish Oil Swelling   Prior to Admission medications   Medication Sig Start Date End Date Taking? Authorizing Provider  ibuprofen (ADVIL) 200 MG tablet Take 2 tablets (400 mg total) by mouth every 6 (six) hours as needed for moderate pain. 12/24/12  Yes Elveria Royals, MD  Multiple Vitamin (MULTIVITAMIN) tablet Take 1 tablet by mouth daily.   Yes  Historical Provider, MD    Review of Systems  Gastrointestinal: Positive for abdominal pain.  Neurological: Positive for dizziness.     Triage Vitals: BP 104/78  Pulse 78  Temp(Src) 98.8 F (37.1 C)  Resp 16  Ht 4' 8.5" (1.435 m)  Wt 132 lb (59.875 kg)  BMI 29.08 kg/m2  SpO2 99%   Objective:  Physical Exam  Nursing note and vitals reviewed. Constitutional: She is oriented to person, place, and time. She appears well-developed and well-nourished.  HENT:  Head: Normocephalic.  Mouth/Throat: Oropharynx is clear and moist.  Eyes: EOM are normal.  Neck: Normal range of motion. Neck supple.  Cardiovascular: Normal rate, regular rhythm and normal heart sounds.  Exam reveals no gallop and no friction rub.   No murmur heard. Pulmonary/Chest: Effort normal and breath sounds normal.  Abdominal: She exhibits no distension and no mass.  Fullness to lower abdomen No definite mass or uterine enlargement  Musculoskeletal: Normal range of motion.  Neurological: She is alert and oriented to person, place, and time.  Psychiatric: She has a normal mood and affect.   Results for orders placed in visit on 09/17/13  POCT URINALYSIS DIPSTICK      Result Value Ref Range   Color, UA yellow     Clarity, UA clear     Glucose, UA neg     Bilirubin, UA neg  Ketones, UA neg     Spec Grav, UA 1.020     Blood, UA neg     pH, UA 7.0     Protein, UA trace     Urobilinogen, UA 0.2     Nitrite, UA neg     Leukocytes, UA Negative    POCT CBC      Result Value Ref Range   WBC 7.2  4.6 - 10.2 K/uL   Lymph, poc 2.2  0.6 - 3.4   POC LYMPH PERCENT 30.3  10 - 50 %L   MID (cbc) 0.7  0 - 0.9   POC MID % 9.1  0 - 12 %M   POC Granulocyte 4.4  2 - 6.9   Granulocyte percent 60.6  37 - 80 %G   RBC 4.77  4.04 - 5.48 M/uL   Hemoglobin 13.9  12.2 - 16.2 g/dL   HCT, POC 42.8  37.7 - 47.9 %   MCV 89.7  80 - 97 fL   MCH, POC 29.1  27 - 31.2 pg   MCHC 32.5  31.8 - 35.4 g/dL   RDW, POC 12.7     Platelet  Count, POC 267  142 - 424 K/uL   MPV 7.7  0 - 99.8 fL  POCT URINE PREGNANCY      Result Value Ref Range   Preg Test, Ur Negative    GLUCOSE, POCT (MANUAL RESULT ENTRY)      Result Value Ref Range   POC Glucose 85  70 - 99 mg/dl    Assessment & Plan:  Patient symptoms are primarily GI upset. Unclear to me whether the dizziness is coming from. Awaiting results of her comprehensive panel and thyroid testing. She was placed on omeprazole 20 one a day for her GI upset. Followup 2 weeks. She was given Antivert to have for her dizziness. I personally performed the services described in this documentation, which was scribed in my presence. The recorded information has been reviewed and is accurate.

## 2013-09-19 LAB — H. PYLORI ANTIBODY, IGG: H PYLORI IGG: 0.77 {ISR}

## 2014-05-18 IMAGING — RF DG HYSTEROGRAM
8 series · 8 of 8 positions shown · IV contrast (omnipaque)
Comparison: None

FLUOROSCOPY TIME:  1 min, 35 seconds

CLINICAL DATA: History of myomectomy.

EXAM:
HYSTEROSALPINGOGRAM
TECHNIQUE: Following cleansing of the cervix and vagina with Betadine solution,
a hysterosalpingogram was performed using a 5-French
hysterosalpingogram catheter and Omnipaque 300 contrast. The patient
tolerated the examination without difficulty.

[Series 1: run · 1 of 1 slices shown (1 of 8)]
[im 1/1]
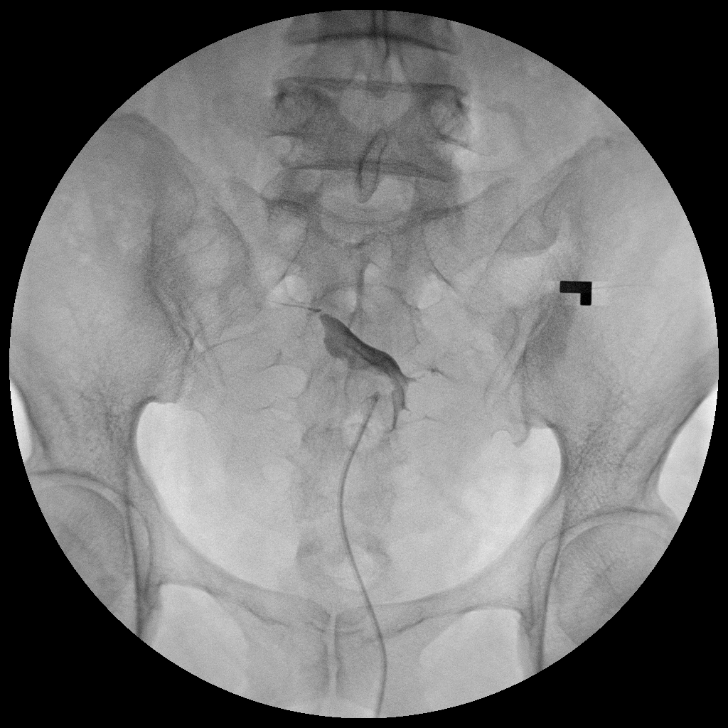

[Series 2: run · 1 of 1 slices shown (2 of 8)]
[im 1/1]
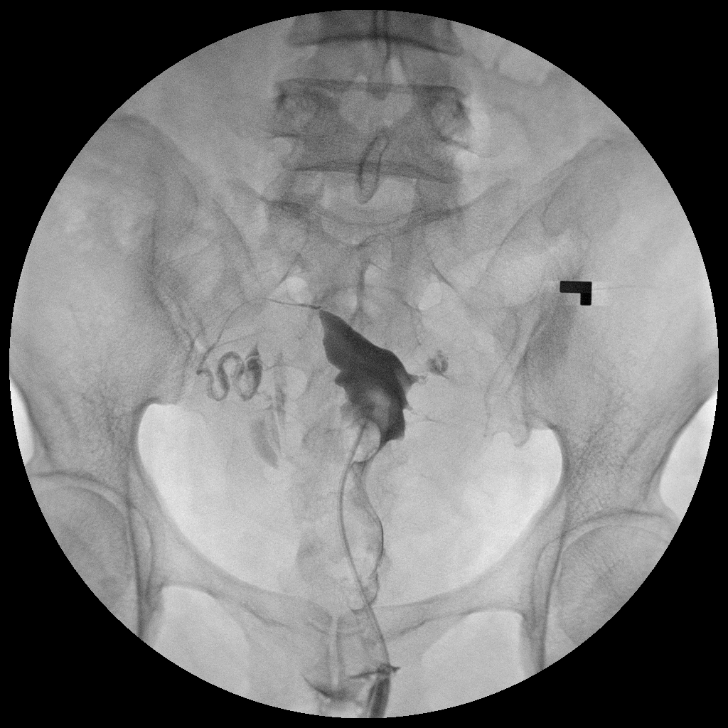

[Series 3: run · 1 of 1 slices shown (3 of 8)]
[im 1/1]
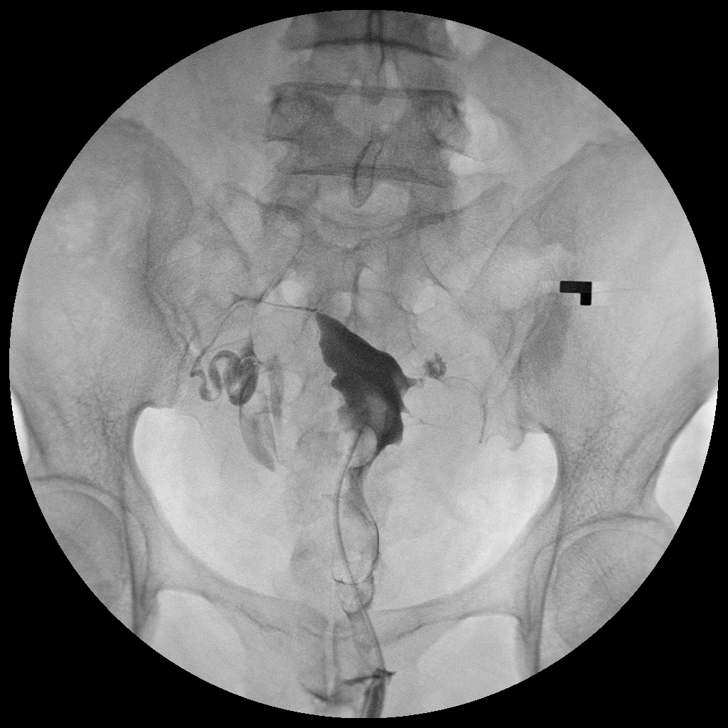

[Series 4: run · 1 of 1 slices shown (4 of 8)]
[im 1/1]
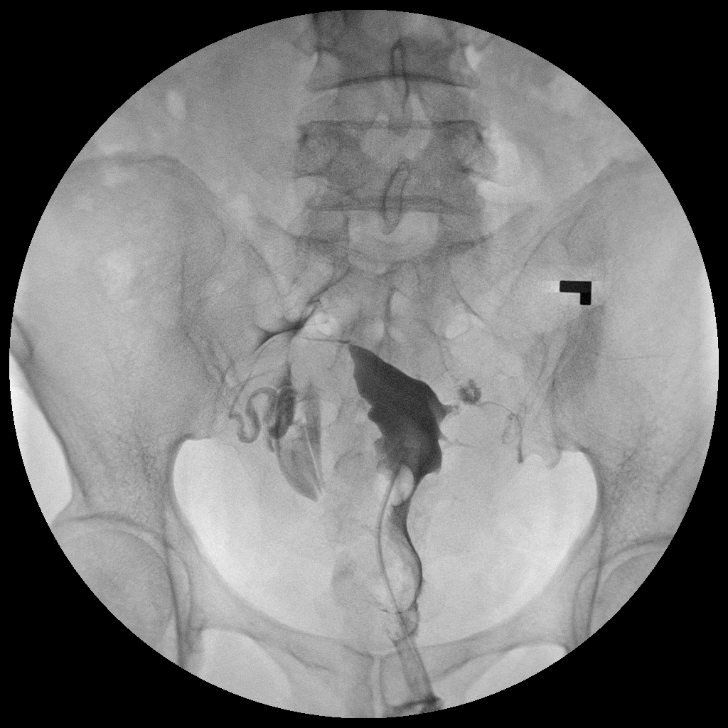

[Series 5: run · 1 of 1 slices shown (5 of 8)]
[im 1/1]
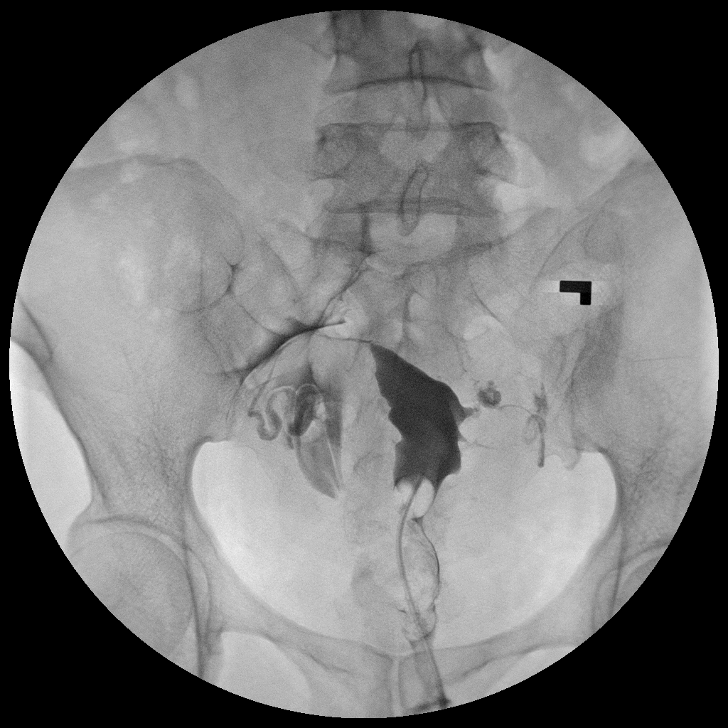

[Series 6: run · 1 of 1 slices shown (6 of 8)]
[im 1/1]
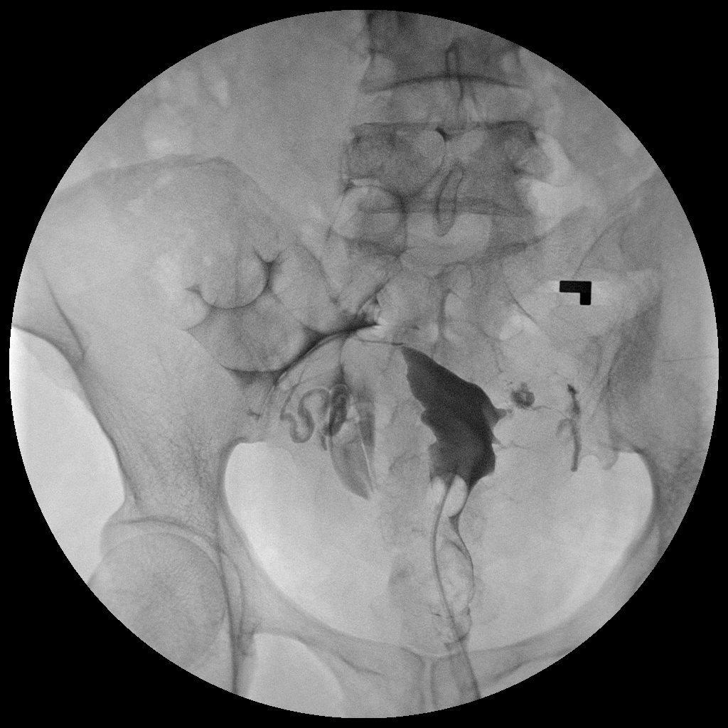

[Series 7: run · 1 of 1 slices shown (7 of 8)]
[im 1/1]
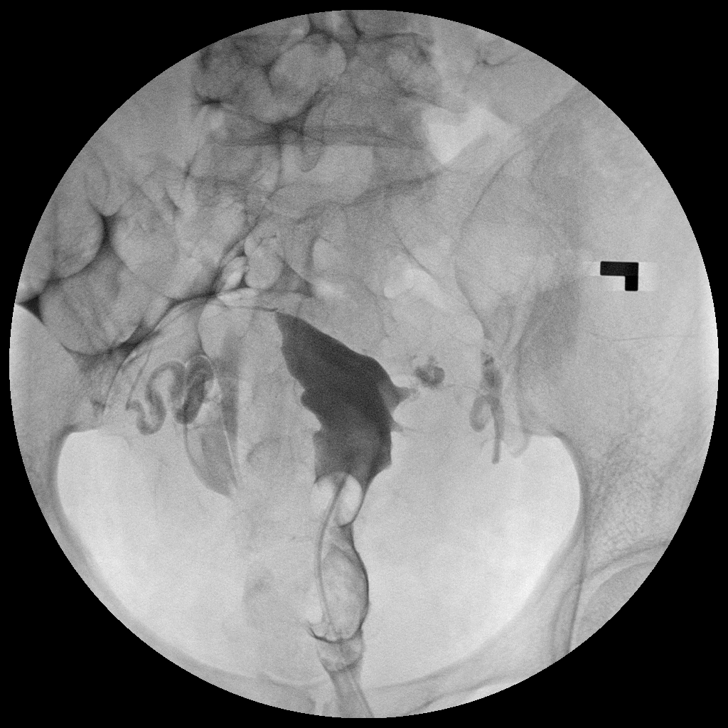

[Series 8: run · 1 of 1 slices shown (8 of 8)]
[im 1/1]
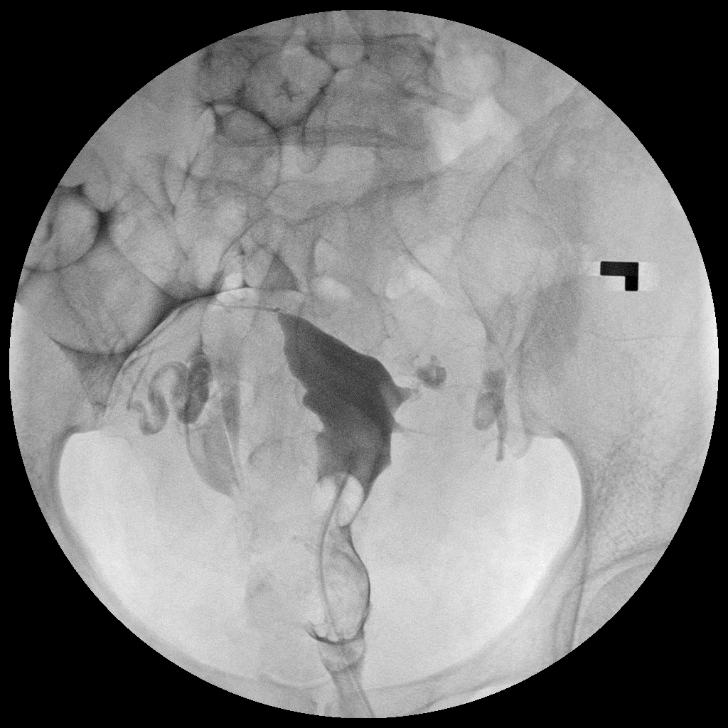

[8 of 8 positions shown; findings below may reference images not displayed]

FINDINGS: The uterine cavity is irregular and enlarged, consistent with
history of previous myomectomy. The right fallopian tube shows
normal contour and peritoneal spill. The left fallopian tube shows
irregular contour suggesting salpingitis isthmica nodosa and lack of
peritoneal spill, consistent with obstruction.
IMPRESSION: 1. Irregular contour of the uterus, consistent with myomectomy.
2. Patent right fallopian tube.
3. Occluded left fallopian tube with findings of salpingitis
isthmica nodosa.
